# Patient Record
Sex: Male | Born: 2005 | Race: White | Hispanic: No | Marital: Single | Smoking: Never smoker
Health system: Southern US, Community
[De-identification: ages and names within clinical notes are randomized; demographics above are authoritative.]

## PROBLEM LIST (undated history)

## (undated) DIAGNOSIS — J309 Allergic rhinitis, unspecified: Secondary | ICD-10-CM

## (undated) DIAGNOSIS — J4530 Mild persistent asthma, uncomplicated: Secondary | ICD-10-CM

## (undated) DIAGNOSIS — E049 Nontoxic goiter, unspecified: Secondary | ICD-10-CM

## (undated) DIAGNOSIS — E109 Type 1 diabetes mellitus without complications: Secondary | ICD-10-CM

## (undated) HISTORY — DX: Allergic rhinitis, unspecified: J30.9

## (undated) HISTORY — DX: Nontoxic goiter, unspecified: E04.9

## (undated) HISTORY — DX: Type 1 diabetes mellitus without complications: E10.9

## (undated) HISTORY — DX: Mild persistent asthma, uncomplicated: J45.30

---
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
## 2023-05-06 ENCOUNTER — Telehealth (INDEPENDENT_AMBULATORY_CARE_PROVIDER_SITE_OTHER): Payer: Self-pay | Admitting: Pediatrics

## 2023-05-06 NOTE — Telephone Encounter (Signed)
  Name of who is calling: Javier Glazier  Caller's Relationship to Patient: Self  Best contact number: [phone]  Provider they see: Judene Companion  Reason for call: Now that pt is 16 he wants to know who Dr. Larinda Buttery would recommend for him to see for adult endo. He also has some questions about the [facility] G[facility], he is trying to transfer to that one.      PRESCRIPTION REFILL ONLY  Name of prescription:  Pharmacy:

## 2023-05-14 ENCOUNTER — Telehealth (INDEPENDENT_AMBULATORY_CARE_PROVIDER_SITE_OTHER): Payer: Self-pay | Admitting: Pediatrics

## 2023-05-14 DIAGNOSIS — E1065 Type 1 diabetes mellitus with hyperglycemia: Secondary | ICD-10-CM

## 2023-05-14 NOTE — Telephone Encounter (Signed)
  Name of who is calling: Lynne Logan Relationship to Patient: Self   Best contact number: [phone]  Provider they see: Dr.Jessup  Reason for call: Mikiah is calling to see if he could get a referral send to adult endo. He is requesting a callback.      PRESCRIPTION REFILL ONLY  Name of prescription:  Pharmacy:

## 2023-05-20 NOTE — Telephone Encounter (Signed)
Laker wants to transfer care to Dr. Crista Curb with [facility].  Will place referral.   Casimiro Needle, MD

## 2023-06-16 NOTE — Telephone Encounter (Signed)
Received fax from [facility] that patient has been scheduled with adult endo on 07/21/2023 at 11 AM.
